# Patient Record
Sex: Male | Born: 1935 | Race: White | Hispanic: No
Health system: Southern US, Community
[De-identification: ages and names within clinical notes are randomized; demographics above are authoritative.]

---

## 2018-09-16 ENCOUNTER — Inpatient Hospital Stay (HOSPITAL_COMMUNITY)
Admission: AD | Admit: 2018-09-16 | Discharge: 2018-09-23 | DRG: 470 | Disposition: E | Payer: Medicare Other | Source: Other Acute Inpatient Hospital | Attending: Internal Medicine | Admitting: Internal Medicine

## 2018-09-16 ENCOUNTER — Encounter (HOSPITAL_COMMUNITY): Payer: Self-pay | Admitting: Internal Medicine

## 2018-09-16 DIAGNOSIS — Z794 Long term (current) use of insulin: Secondary | ICD-10-CM

## 2018-09-16 DIAGNOSIS — S72002A Fracture of unspecified part of neck of left femur, initial encounter for closed fracture: Principal | ICD-10-CM

## 2018-09-16 DIAGNOSIS — A419 Sepsis, unspecified organism: Secondary | ICD-10-CM | POA: Diagnosis not present

## 2018-09-16 DIAGNOSIS — S2241XA Multiple fractures of ribs, right side, initial encounter for closed fracture: Secondary | ICD-10-CM | POA: Diagnosis present

## 2018-09-16 DIAGNOSIS — E1165 Type 2 diabetes mellitus with hyperglycemia: Secondary | ICD-10-CM | POA: Diagnosis not present

## 2018-09-16 DIAGNOSIS — L97929 Non-pressure chronic ulcer of unspecified part of left lower leg with unspecified severity: Secondary | ICD-10-CM | POA: Diagnosis not present

## 2018-09-16 DIAGNOSIS — R0602 Shortness of breath: Secondary | ICD-10-CM

## 2018-09-16 DIAGNOSIS — E119 Type 2 diabetes mellitus without complications: Secondary | ICD-10-CM

## 2018-09-16 DIAGNOSIS — N390 Urinary tract infection, site not specified: Secondary | ICD-10-CM | POA: Diagnosis present

## 2018-09-16 DIAGNOSIS — M810 Age-related osteoporosis without current pathological fracture: Secondary | ICD-10-CM | POA: Diagnosis not present

## 2018-09-16 DIAGNOSIS — Z09 Encounter for follow-up examination after completed treatment for conditions other than malignant neoplasm: Secondary | ICD-10-CM

## 2018-09-16 DIAGNOSIS — W1830XA Fall on same level, unspecified, initial encounter: Secondary | ICD-10-CM | POA: Diagnosis present

## 2018-09-16 DIAGNOSIS — L97918 Non-pressure chronic ulcer of unspecified part of right lower leg with other specified severity: Secondary | ICD-10-CM | POA: Diagnosis present

## 2018-09-16 DIAGNOSIS — E1110 Type 2 diabetes mellitus with ketoacidosis without coma: Secondary | ICD-10-CM | POA: Insufficient documentation

## 2018-09-16 DIAGNOSIS — N3000 Acute cystitis without hematuria: Secondary | ICD-10-CM | POA: Diagnosis not present

## 2018-09-16 DIAGNOSIS — L97919 Non-pressure chronic ulcer of unspecified part of right lower leg with unspecified severity: Secondary | ICD-10-CM | POA: Diagnosis present

## 2018-09-16 DIAGNOSIS — R740 Nonspecific elevation of levels of transaminase and lactic acid dehydrogenase [LDH]: Secondary | ICD-10-CM | POA: Diagnosis present

## 2018-09-16 DIAGNOSIS — I878 Other specified disorders of veins: Secondary | ICD-10-CM | POA: Diagnosis present

## 2018-09-16 DIAGNOSIS — I469 Cardiac arrest, cause unspecified: Secondary | ICD-10-CM | POA: Diagnosis not present

## 2018-09-16 DIAGNOSIS — Y92009 Unspecified place in unspecified non-institutional (private) residence as the place of occurrence of the external cause: Secondary | ICD-10-CM

## 2018-09-16 DIAGNOSIS — E11622 Type 2 diabetes mellitus with other skin ulcer: Secondary | ICD-10-CM | POA: Diagnosis present

## 2018-09-16 DIAGNOSIS — I10 Essential (primary) hypertension: Secondary | ICD-10-CM | POA: Diagnosis present

## 2018-09-16 DIAGNOSIS — T148XXA Other injury of unspecified body region, initial encounter: Secondary | ICD-10-CM

## 2018-09-16 DIAGNOSIS — D696 Thrombocytopenia, unspecified: Secondary | ICD-10-CM | POA: Diagnosis not present

## 2018-09-16 DIAGNOSIS — S7292XA Unspecified fracture of left femur, initial encounter for closed fracture: Secondary | ICD-10-CM | POA: Insufficient documentation

## 2018-09-16 LAB — GLUCOSE, CAPILLARY: GLUCOSE-CAPILLARY: 174 mg/dL — AB (ref 70–99)

## 2018-09-16 NOTE — H&P (Signed)
History and Physical   Tom Little FAO:[id number] DOB: [birth year] DOA: 08/30/2018  Referring MD/NP/PA: From Cy Blamer  PCP: No primary care provider on file.   Outpatient Specialists:  in [place]  Patient coming from: [place]  Chief Complaint: Left hip pain  HPI: Tom Little is a 83 y.o. male with medical history significant of diabetes, hypertension, gout, multiple feet ulcers due to diabetes, recent sepsis due to UTI, UTI with some rhabdomyolysis who sustained a mechanical fall at home and was seen at hospital in [facility].  Patient apparently took his diabetic medications at night.  He was using a cane to go to the bathroom when he fell.  Was admitted at [facility] on October 20 S.  He fell on October 17.  He apparently has a 24-hour caregiver who was unable to move him at the time.  He has been globally weak.  On arrival at the hospital he was found to have significant UTI and sepsis.  This was treated.  Patient also found to have left hip fracture.  Initial recommendations was for outpatient follow-up.  Patient is agreed.  He requests transfer and patient was subsequently transferred here for surgical repair.  His x-rays at [facility] showed right-sided rib fractures and small right effusion with no pneumothorax.  Also pulmonary contusion.  X-ray of the feet showed possible fracture of the lateral malleolus of the left ankle.  He also had acute thrombocytopenia as well as transaminitis.  Elevated CPK reflecting mild rhabdomyolysis we also noted.  Patient also had right thumb dislocation which was reduced.  His fracture was discovered on 1023.  Orthopedic group over there refused to do surgery because they have fired him from their practice.  He apparently had grievances of harassment with their staff.  Dr. Everardo Pacific has accepted patient here for surgery   Review of Systems: As per HPI otherwise 10 point review of systems negative.    History reviewed.  No pertinent past medical history.  History reviewed. No pertinent surgical history.   has no tobacco, alcohol, and drug history on file.  Allergies not on file  History reviewed. No pertinent family history.   Prior to Admission medications   Not on File    Physical Exam: Vitals:   09/17/2018 2303  BP: (!) 153/72  Pulse: 72  Resp: 18  Temp: 98.3 F (36.8 C)  TempSrc: Tympanic  SpO2: 92%      Constitutional: NAD, calm, comfortable, frail, weak. Vitals:   09/01/2018 2303  BP: (!) 153/72  Pulse: 72  Resp: 18  Temp: 98.3 F (36.8 C)  TempSrc: Tympanic  SpO2: 92%   Eyes: PERRL, lids and conjunctivae normal ENMT: Mucous membranes are moist. Posterior pharynx clear of any exudate or lesions.Normal dentition.  Neck: normal, supple, no masses, no thyromegaly Respiratory: clear to auscultation bilaterally, no wheezing, no crackles. Normal respiratory effort. No accessory muscle use.  Cardiovascular: Regular rate and rhythm, no murmurs / rubs / gallops. No extremity edema. 2+ pedal pulses. No carotid bruits.  Abdomen: no tenderness, no masses palpated. No hepatosplenomegaly. Bowel sounds positive.  Musculoskeletal: Laterally rotated left leg, no clubbing / cyanosis.  Right thumb deformity upper and lower extremities. Good ROM, no contractures. Normal muscle tone.  Skin: no rashes, lesions, multiple leg ulcers. No induration Neurologic: CN 2-12 grossly intact. Sensation intact, DTR normal. Strength 5/5 in all 4.  Psychiatric: Normal judgment and insight. Alert and oriented x 3. Normal mood.     Labs on  Admission: I have personally reviewed following labs and imaging studies  CBC: No results for input(s): WBC, NEUTROABS, HGB, HCT, MCV, PLT in the last 168 hours. Basic Metabolic Panel: No results for input(s): NA, K, CL, CO2, GLUCOSE, BUN, CREATININE, CALCIUM, MG, PHOS in the last 168 hours. GFR: CrCl cannot be calculated (No successful lab value found.). Liver Function  Tests: No results for input(s): AST, ALT, ALKPHOS, BILITOT, PROT, ALBUMIN in the last 168 hours. No results for input(s): LIPASE, AMYLASE in the last 168 hours. No results for input(s): AMMONIA in the last 168 hours. Coagulation Profile: No results for input(s): INR, PROTIME in the last 168 hours. Cardiac Enzymes: No results for input(s): CKTOTAL, CKMB, CKMBINDEX, TROPONINI in the last 168 hours. BNP (last 3 results) No results for input(s): PROBNP in the last 8760 hours. HbA1C: No results for input(s): HGBA1C in the last 72 hours. CBG: Recent Labs  Lab 09/02/2018 2309  GLUCAP 174*   Lipid Profile: No results for input(s): CHOL, HDL, LDLCALC, TRIG, CHOLHDL, LDLDIRECT in the last 72 hours. Thyroid Function Tests: No results for input(s): TSH, T4TOTAL, FREET4, T3FREE, THYROIDAB in the last 72 hours. Anemia Panel: No results for input(s): VITAMINB12, FOLATE, FERRITIN, TIBC, IRON, RETICCTPCT in the last 72 hours. Urine analysis: No results found for: COLORURINE, APPEARANCEUR, LABSPEC, PHURINE, GLUCOSEU, HGBUR, BILIRUBINUR, KETONESUR, PROTEINUR, UROBILINOGEN, NITRITE, LEUKOCYTESUR Sepsis Labs: @LABRCNTIP (procalcitonin:4,lacticidven:4) )No results found for this or any previous visit (from the past 240 hour(s)).   Radiological Exams on Admission: No results found.  Assessment/Plan Principal Problem:   Fracture of femoral neck, left (HCC) Active Problems:   UTI (urinary tract infection)   Sepsis (HCC)   HTN (hypertension)   Ulcers of both lower legs (HCC)   Diabetes (HCC)     #1 left femoral neck fracture: Patient looks hemodynamically stable.  Medically cleared.  Orthopedics would likely do surgery tomorrow.  N.p.o. after midnight.  #2 recent UTI: Continue antibiotics to complete treatment.  Review culture and sensitivity results from outside hospital.  #3 recent sepsis.  Due to UTI. Appears to have resolved.  Continue treatment  #4 multiple skin ulcers: Wound care consult.   Most likely secondary to diabetes.  #5 diabetes: Sliding scale insulin will be added.  Update patient's home regimen and continue.  #6 hypertension: Blood pressure appears reasonably controlled on arrival.  Will monitor closely.   DVT prophylaxis: Heparin Code Status: Full Family Communication: No family available Disposition Plan: To be determined Consults called: Dr. Everardo Pacific, orthopedics Admission status: Inpatient  Severity of Illness: The appropriate patient status for this patient is INPATIENT. Inpatient status is judged to be reasonable and necessary in order to provide the required intensity of service to ensure the patient's safety. The patient's presenting symptoms, physical exam findings, and initial radiographic and laboratory data in the context of their chronic comorbidities is felt to place them at high risk for further clinical deterioration. Furthermore, it is not anticipated that the patient will be medically stable for discharge from the hospital within 2 midnights of admission. The following factors support the patient status of inpatient.   " The patient's presenting symptoms include left hip pain. " The worrisome physical exam findings include ambulation and decreased range of motion on the left hip. " The initial radiographic and laboratory data are worrisome because of evidence of hip fracture on the left. " The chronic co-morbidities include osteoporosis.   * I certify that at the point of admission it is my clinical judgment that the patient  will require inpatient hospital care spanning beyond 2 midnights from the point of admission due to high intensity of service, high risk for further deterioration and high frequency of surveillance required.Lonia Blood MD [facility] Pager [phone]  If 7PM-7AM, please contact night-coverage [email or web address] Password [email or web address]  09/17/2018, 12:09 AM

## 2018-09-17 ENCOUNTER — Inpatient Hospital Stay (HOSPITAL_COMMUNITY): Payer: Medicare Other

## 2018-09-17 DIAGNOSIS — E119 Type 2 diabetes mellitus without complications: Secondary | ICD-10-CM

## 2018-09-17 DIAGNOSIS — E1165 Type 2 diabetes mellitus with hyperglycemia: Secondary | ICD-10-CM

## 2018-09-17 DIAGNOSIS — Z794 Long term (current) use of insulin: Secondary | ICD-10-CM

## 2018-09-17 LAB — COMPREHENSIVE METABOLIC PANEL
ALBUMIN: 2.1 g/dL — AB (ref 3.5–5.0)
ALK PHOS: 68 U/L (ref 38–126)
ALT: 50 U/L — AB (ref 0–44)
AST: 47 U/L — AB (ref 15–41)
Anion gap: 8 (ref 5–15)
BUN: 26 mg/dL — AB (ref 8–23)
CALCIUM: 8.1 mg/dL — AB (ref 8.9–10.3)
CO2: 25 mmol/L (ref 22–32)
CREATININE: 1.08 mg/dL (ref 0.61–1.24)
Chloride: 109 mmol/L (ref 98–111)
GFR calc Af Amer: >60 mL/min (ref >60)
GFR calc non Af Amer: >60 mL/min (ref >60)
GLUCOSE: 121 mg/dL — AB (ref 70–99)
Potassium: 4.1 mmol/L (ref 3.5–5.1)
Sodium: 142 mmol/L (ref 135–145)
TOTAL PROTEIN: 5.3 g/dL — AB (ref 6.5–8.1)
Total Bilirubin: 1.2 mg/dL (ref 0.3–1.2)

## 2018-09-17 LAB — GLUCOSE, CAPILLARY
Glucose-Capillary: 112 mg/dL — ABNORMAL HIGH (ref 70–99)
Glucose-Capillary: 104 mg/dL — ABNORMAL HIGH (ref 70–99)
Glucose-Capillary: 161 mg/dL — ABNORMAL HIGH (ref 70–99)
Glucose-Capillary: 142 mg/dL — ABNORMAL HIGH (ref 70–99)

## 2018-09-17 LAB — CBC
HCT: 41.8 % (ref 39.0–52.0)
Hemoglobin: 13 g/dL (ref 13.0–17.0)
MCH: 28.5 pg (ref 26.0–34.0)
MCHC: 31.1 g/dL (ref 30.0–36.0)
MCV: 91.7 fL (ref 80.0–100.0)
PLATELETS: 133 10*3/uL — AB (ref 150–400)
RBC: 4.56 MIL/uL (ref 4.22–5.81)
RDW: 14.3 % (ref 11.5–15.5)
WBC: 10 10*3/uL (ref 4.0–10.5)
nRBC: 0.2 % (ref 0.0–0.2)

## 2018-09-17 LAB — SURGICAL PCR SCREEN
MRSA, PCR: NEGATIVE
STAPHYLOCOCCUS AUREUS: NEGATIVE

## 2018-09-17 LAB — TSH: TSH: 6.346 u[IU]/mL — AB (ref 0.350–4.500)

## 2018-09-17 MED ORDER — INSULIN ASPART 100 UNIT/ML ~~LOC~~ SOLN
0.0000 [IU] | Freq: Every day | SUBCUTANEOUS | Status: DC
  Administered 2018-09-18: 3 [IU] via SUBCUTANEOUS

## 2018-09-17 MED ORDER — HEPARIN SODIUM (PORCINE) 5000 UNIT/ML IJ SOLN: 5000.0000 [IU] | Freq: Three times a day (TID) | INTRAMUSCULAR | Status: DC

## 2018-09-17 MED ORDER — MORPHINE SULFATE (PF) 2 MG/ML IV SOLN
2.0000 mg | INTRAVENOUS | Status: DC | PRN
  Administered 2018-09-17 – 2018-09-18 (×5): 2 mg via INTRAVENOUS
  Filled 2018-09-17 (×6): qty 1

## 2018-09-17 MED ORDER — ALLOPURINOL 300 MG PO TABS
300.0000 mg | ORAL_TABLET | Freq: Every day | ORAL | Status: DC
  Administered 2018-09-17: 300 mg via ORAL
  Filled 2018-09-17 (×2): qty 1

## 2018-09-17 MED ORDER — SODIUM CHLORIDE 0.9% FLUSH
10.0000 mL | Freq: Two times a day (BID) | INTRAVENOUS | Status: DC
  Administered 2018-09-17 – 2018-09-18 (×3): 10 mL

## 2018-09-17 MED ORDER — LISINOPRIL 40 MG PO TABS
40.0000 mg | ORAL_TABLET | Freq: Every day | ORAL | Status: DC
  Administered 2018-09-17: 40 mg via ORAL
  Filled 2018-09-17 (×2): qty 1

## 2018-09-17 MED ORDER — ONDANSETRON HCL 4 MG PO TABS: 4.0000 mg | ORAL_TABLET | Freq: Four times a day (QID) | ORAL | Status: DC | PRN

## 2018-09-17 MED ORDER — SODIUM CHLORIDE 0.9 % IV SOLN
INTRAVENOUS | Status: DC
  Administered 2018-09-17: 01:00:00 via INTRAVENOUS

## 2018-09-17 MED ORDER — IPRATROPIUM-ALBUTEROL 0.5-2.5 (3) MG/3ML IN SOLN: 3.0000 mL | RESPIRATORY_TRACT | Status: DC | PRN

## 2018-09-17 MED ORDER — INSULIN ASPART 100 UNIT/ML ~~LOC~~ SOLN
0.0000 [IU] | Freq: Three times a day (TID) | SUBCUTANEOUS | Status: DC
  Administered 2018-09-17: 3 [IU] via SUBCUTANEOUS
  Administered 2018-09-18: 8 [IU] via SUBCUTANEOUS
  Administered 2018-09-18 – 2018-09-19 (×2): 3 [IU] via SUBCUTANEOUS

## 2018-09-17 MED ORDER — FUROSEMIDE 10 MG/ML IJ SOLN
60.0000 mg | Freq: Once | INTRAMUSCULAR | Status: AC
  Administered 2018-09-17: 60 mg via INTRAVENOUS
  Filled 2018-09-17: qty 6

## 2018-09-17 MED ORDER — ONDANSETRON HCL 4 MG/2ML IJ SOLN: 4.0000 mg | Freq: Four times a day (QID) | INTRAMUSCULAR | Status: DC | PRN

## 2018-09-17 MED ORDER — SODIUM CHLORIDE 0.9% FLUSH: 10.0000 mL | INTRAVENOUS | Status: DC | PRN

## 2018-09-17 MED ORDER — HYDROCERIN EX CREA
TOPICAL_CREAM | Freq: Two times a day (BID) | CUTANEOUS | Status: DC
  Administered 2018-09-17 – 2018-09-18 (×2): via TOPICAL
  Filled 2018-09-17: qty 113

## 2018-09-17 NOTE — Progress Notes (Signed)
Patient transferred from [facility] via [facility]. Patient is alert and oriented. Patient oriented to the floor. Call bell within reach. Will continue to monitor.

## 2018-09-17 NOTE — Progress Notes (Signed)
Patient ID: Tom Little, male   DOB: 07-13-1935, 83 y.o.   MRN: [id number]  PROGRESS NOTE    Rojelio Uhrich  AOZ:[id number] DOB: 1935/03/07 DOA: 08/23/2018 PCP: No primary care provider on file.   Brief Narrative:  83 year old male with history of diabetes, hypertension, gout, multiple feet ulcers due to diabetes who had fallen at home on September 08, 2018 and was admitted to [facility] few days ago for sepsis secondary to UTI along with rhabdomyolysis along with left hip fracture, right-sided rib fractures with small right effusion but no pneumothorax and possible fracture of the lateral malleolus of the left ankle.  Patient was transferred to [facility] on 09/21/2018 for further  orthopedic intervention.  Assessment & Plan:   Principal Problem:   Fracture of femoral neck, left (HCC) Active Problems:   UTI (urinary tract infection)   Sepsis (HCC)   HTN (hypertension)   Ulcers of both lower legs (HCC)   Diabetes (HCC)   Left femoral neck fracture after mechanical fall -Spoke to Dr. Bo Mcclintock on phone who will see the patient in consultation.  For probable surgical intervention on Sep 22, 2018.  We will keep n.p.o. past midnight. -Fall precautions.  Pain management  Recent treatment for UTI and sepsis -Patient has completed antibiotic treatment and sepsis has resolved.  No signs of infection at this time.  Will hold off on antibiotics  Multiple skin ulcers -Wound care consult  Diabetes mellitus type 2 -Continue Accu-Cheks with sliding scale coverage.  Hypertension -Blood pressure on the higher side.  Will monitor.  Mild transaminitis -Most likely secondary to recent rhabdomyolysis.  Monitor  Thrombocytopenia -Questionable cause.  Monitor.  No signs of bleeding   DVT prophylaxis: We will hold Lovenox in preparation of surgery Code Status: Full Family Communication: None at bedside Disposition Plan: Depends on clinical outcome  Consultants:  Orthopedics  Procedures: None  Antimicrobials: None   Subjective: Patient seen and examined at bedside.  He is a poor historian.  Complains of hip pain.  No overnight fever or vomiting.  Objective: Vitals:   08/26/2018 2303 09/17/18 0348  BP: (!) 153/72 (!) 179/77  Pulse: 72 61  Resp: 18   Temp: 98.3 F (36.8 C) 97.8 F (36.6 C)  TempSrc: Tympanic Axillary  SpO2: 92% 95%    Intake/Output Summary (Last 24 hours) at 09/17/2018 9629 Last data filed at 09/17/2018 5284 Gross per 24 hour  Intake 218.6 ml  Output -  Net 218.6 ml   There were no vitals filed for this visit.  Examination:  General exam: Appears calm and comfortable.  Poor historian. Respiratory system: Bilateral decreased breath sounds at bases Cardiovascular system: S1 & S2 heard, Rate controlled Gastrointestinal system: Abdomen is nondistended, soft and nontender. Normal bowel sounds heard. Extremities: No cyanosis, edema.  Multiple leg ulcers present   Data Reviewed: I have personally reviewed following labs and imaging studies  CBC: Recent Labs  Lab 09/17/18 0736  WBC 10.0  HGB 13.0  HCT 41.8  MCV 91.7  PLT 133*   Basic Metabolic Panel: Recent Labs  Lab 09/17/18 0736  NA 142  K 4.1  CL 109  CO2 25  GLUCOSE 121*  BUN 26*  CREATININE 1.08  CALCIUM 8.1*   GFR: CrCl cannot be calculated (Unknown ideal weight.). Liver Function Tests: Recent Labs  Lab 09/17/18 0736  AST 47*  ALT 50*  ALKPHOS 68  BILITOT 1.2  PROT 5.3*  ALBUMIN 2.1*   No results for input(s): LIPASE, AMYLASE  in the last 168 hours. No results for input(s): AMMONIA in the last 168 hours. Coagulation Profile: No results for input(s): INR, PROTIME in the last 168 hours. Cardiac Enzymes: No results for input(s): CKTOTAL, CKMB, CKMBINDEX, TROPONINI in the last 168 hours. BNP (last 3 results) No results for input(s): PROBNP in the last 8760 hours. HbA1C: No results for input(s): HGBA1C in the last 72  hours. CBG: Recent Labs  Lab 09-28-2018 2309 09/17/18 0645  GLUCAP 174* 112*   Lipid Profile: No results for input(s): CHOL, HDL, LDLCALC, TRIG, CHOLHDL, LDLDIRECT in the last 72 hours. Thyroid Function Tests: Recent Labs    09/17/18 0736  TSH 6.346*   Anemia Panel: No results for input(s): VITAMINB12, FOLATE, FERRITIN, TIBC, IRON, RETICCTPCT in the last 72 hours. Sepsis Labs: No results for input(s): PROCALCITON, LATICACIDVEN in the last 168 hours.  Recent Results (from the past 240 hour(s))  Surgical pcr screen     Status: None   Collection Time: 09/17/18  1:58 AM  Result Value Ref Range Status   MRSA, PCR NEGATIVE NEGATIVE Final   Staphylococcus aureus NEGATIVE NEGATIVE Final    Comment: (NOTE) The Xpert SA Assay (FDA approved for NASAL specimens in patients 61 years of age and older), is one component of a comprehensive surveillance program. It is not intended to diagnose infection nor to guide or monitor treatment. Performed at [facility], [address] [place], [place] [address]          Radiology Studies: No results found.      Scheduled Meds: . insulin aspart  0-15 Units Subcutaneous TID WC  . insulin aspart  0-5 Units Subcutaneous QHS   Continuous Infusions: . sodium chloride 100 mL/hr at 09/17/18 0100     LOS: 1 day        Glade Lloyd, MD [facility] Pager [phone]  If 7PM-7AM, please contact night-coverage [email or web address] Password [facility] 09/17/2018, 9:39 AM

## 2018-09-17 NOTE — Consult Note (Signed)
ORTHOPAEDIC CONSULTATION  REQUESTING PHYSICIAN: Aline August, MD  Chief Complaint: Left hip fracture  HPI: Tom Little is a 83 y.o. male with recent hospitalization in [place] for sepsis.  He apparently per report had falls prior to admission a week ago and it was determined yeterday that he had a left femoral neck fracture.    Due to some social issues the patient been fired from orthopedic practices near his hospital where he was admitted and was unable to be transferred to 1 of their facilities.  He apparently per report had threatened staff at these facilities and they were uncomfortable taking care of him.  We agreed to have him transferred to the hospital service and provide the orthopedic services indicated.  Patient arrived overnight were not alerted to his presence.  He has an injury to his thumb but he has no imaging of this is unable to determine what this may be.  He does have pain in his left hip as well. History reviewed. No pertinent past medical history. History reviewed. No pertinent surgical history. Social History   Socioeconomic History  . Marital status: Widowed    Spouse name: Not on file  . Number of children: Not on file  . Years of education: Not on file  . Highest education level: Not on file  Occupational History  . Not on file  Social Needs  . Financial resource strain: Not on file  . Food insecurity:    Worry: Not on file    Inability: Not on file  . Transportation needs:    Medical: Not on file    Non-medical: Not on file  Tobacco Use  . Smoking status: Not on file  Substance and Sexual Activity  . Alcohol use: Not on file  . Drug use: Not on file  . Sexual activity: Not on file  Lifestyle  . Physical activity:    Days per week: Not on file    Minutes per session: Not on file  . Stress: Not on file  Relationships  . Social connections:    Talks on phone: Not on file    Gets together: Not on file    Attends  religious service: Not on file    Active member of club or organization: Not on file    Attends meetings of clubs or organizations: Not on file    Relationship status: Not on file  Other Topics Concern  . Not on file  Social History Narrative  . Not on file   History reviewed. No pertinent family history. Not on File Prior to Admission medications   Not on File   No results found. Family History Reviewed and non-contributory, no pertinent history of problems with bleeding or anesthesia      Review of Systems 14 system ROS conducted and negative except for that noted in HPI   OBJECTIVE  Vitals: Patient Vitals for the past 8 hrs:  BP Temp Temp src Pulse SpO2  09/17/18 0348 (!) 179/77 97.8 F (36.6 C) Axillary 61 95 %   General: Alert, no acute distress Cardiovascular: No pedal edema Respiratory: No cyanosis, no use of accessory musculature GI: No organomegaly, abdomen is soft and non-tender Skin: No lesions in the area of chief complaint other than those listed below in MSK exam.  Neurologic: Sensation intact distally save for the below mentioned MSK exam Psychiatric: Patient is competent for consent with normal mood and affect Lymphatic: No axillary or cervical lymphadenopathy Extremities  Left lower extremity: Venous stasis ulcers and chronic ulcerations around the heel noted, externally rotated and shortened extremity, able to wiggle toes further testing precluded due to patient factors.  No sign of current active infection however, warm foot difficult to palpate pulses.  Right lower extremity chronic venous stasis changes and bedsores noted.  Right thumb some tenderness to palpation but no obvious deformity, splint in place though not well fitting.    Test Results Imaging Viewed x-rays on disc demonstrated a displaced left femoral neck fracture, ankle x-rays on the left were reviewed and no obvious acute osseous abnormality.  Labs cbc Recent Labs     09/17/18 0736  WBC 10.0  HGB 13.0  HCT 41.8  PLT 133*    Labs inflam No results for input(s): CRP in the last 72 hours.  Invalid input(s): ESR  Labs coag No results for input(s): INR, PTT in the last 72 hours.  Invalid input(s): PT  Recent Labs    09/17/18 0736  NA 142  K 4.1  CL 109  CO2 25  GLUCOSE 121*  BUN 26*  CREATININE 1.08  CALCIUM 8.1*     ASSESSMENT AND PLAN: 83 y.o. male with the following: Left femoral neck fracture, possible right thumb injury  Patient's late arrival in the OR schedule precluded timely surgery today.  Additionally he needs further work-up with x-rays prior to going forward to the OR.  We felt that is more appropriate as the patient set up for first thing the morning tomorrow.  Discussed the nature of the injury as well as the care with the patient as well as the family.  Nonoperative measures are not well tolerated as patient's on bedrest for extended periods of time tend to develop secondary issues such as pneumonia, urinary tract infections, bedsores and delirium.  Based on this our recommendation is for operative measures.  The risks benefits and alternatives were discussed with the patient including but not limited to the risks of nonoperative treatment, versus surgical intervention including infection, bleeding, nerve injury, periprosthetic fracture, the need for revision surgery, leg length discrepancy, gait change, blood clots, cardiopulmonary complications, morbidity, mortality, among others, and they were willing to proceed.

## 2018-09-17 NOTE — H&P (View-Only) (Signed)
ORTHOPAEDIC CONSULTATION  REQUESTING PHYSICIAN: Aline August, MD  Chief Complaint: Left hip fracture  HPI: Tom Little is a 83 y.o. male with recent hospitalization in [place] for sepsis.  He apparently per report had falls prior to admission a week ago and it was determined yeterday that he had a left femoral neck fracture.    Due to some social issues the patient been fired from orthopedic practices near his hospital where he was admitted and was unable to be transferred to 1 of their facilities.  He apparently per report had threatened staff at these facilities and they were uncomfortable taking care of him.  We agreed to have him transferred to the hospital service and provide the orthopedic services indicated.  Patient arrived overnight were not alerted to his presence.  He has an injury to his thumb but he has no imaging of this is unable to determine what this may be.  He does have pain in his left hip as well. History reviewed. No pertinent past medical history. History reviewed. No pertinent surgical history. Social History   Socioeconomic History  . Marital status: Widowed    Spouse name: Not on file  . Number of children: Not on file  . Years of education: Not on file  . Highest education level: Not on file  Occupational History  . Not on file  Social Needs  . Financial resource strain: Not on file  . Food insecurity:    Worry: Not on file    Inability: Not on file  . Transportation needs:    Medical: Not on file    Non-medical: Not on file  Tobacco Use  . Smoking status: Not on file  Substance and Sexual Activity  . Alcohol use: Not on file  . Drug use: Not on file  . Sexual activity: Not on file  Lifestyle  . Physical activity:    Days per week: Not on file    Minutes per session: Not on file  . Stress: Not on file  Relationships  . Social connections:    Talks on phone: Not on file    Gets together: Not on file    Attends  religious service: Not on file    Active member of club or organization: Not on file    Attends meetings of clubs or organizations: Not on file    Relationship status: Not on file  Other Topics Concern  . Not on file  Social History Narrative  . Not on file   History reviewed. No pertinent family history. Not on File Prior to Admission medications   Not on File   No results found. Family History Reviewed and non-contributory, no pertinent history of problems with bleeding or anesthesia      Review of Systems 14 system ROS conducted and negative except for that noted in HPI   OBJECTIVE  Vitals: Patient Vitals for the past 8 hrs:  BP Temp Temp src Pulse SpO2  09/17/18 0348 (!) 179/77 97.8 F (36.6 C) Axillary 61 95 %   General: Alert, no acute distress Cardiovascular: No pedal edema Respiratory: No cyanosis, no use of accessory musculature GI: No organomegaly, abdomen is soft and non-tender Skin: No lesions in the area of chief complaint other than those listed below in MSK exam.  Neurologic: Sensation intact distally save for the below mentioned MSK exam Psychiatric: Patient is competent for consent with normal mood and affect Lymphatic: No axillary or cervical lymphadenopathy Extremities  Left lower extremity: Venous stasis ulcers and chronic ulcerations around the heel noted, externally rotated and shortened extremity, able to wiggle toes further testing precluded due to patient factors.  No sign of current active infection however, warm foot difficult to palpate pulses.  Right lower extremity chronic venous stasis changes and bedsores noted.  Right thumb some tenderness to palpation but no obvious deformity, splint in place though not well fitting.    Test Results Imaging Viewed x-rays on disc demonstrated a displaced left femoral neck fracture, ankle x-rays on the left were reviewed and no obvious acute osseous abnormality.  Labs cbc Recent Labs     09/17/18 0736  WBC 10.0  HGB 13.0  HCT 41.8  PLT 133*    Labs inflam No results for input(s): CRP in the last 72 hours.  Invalid input(s): ESR  Labs coag No results for input(s): INR, PTT in the last 72 hours.  Invalid input(s): PT  Recent Labs    09/17/18 0736  NA 142  K 4.1  CL 109  CO2 25  GLUCOSE 121*  BUN 26*  CREATININE 1.08  CALCIUM 8.1*     ASSESSMENT AND PLAN: 83 y.o. male with the following: Left femoral neck fracture, possible right thumb injury  Patient's late arrival in the OR schedule precluded timely surgery today.  Additionally he needs further work-up with x-rays prior to going forward to the OR.  We felt that is more appropriate as the patient set up for first thing the morning tomorrow.  Discussed the nature of the injury as well as the care with the patient as well as the family.  Nonoperative measures are not well tolerated as patient's on bedrest for extended periods of time tend to develop secondary issues such as pneumonia, urinary tract infections, bedsores and delirium.  Based on this our recommendation is for operative measures.  The risks benefits and alternatives were discussed with the patient including but not limited to the risks of nonoperative treatment, versus surgical intervention including infection, bleeding, nerve injury, periprosthetic fracture, the need for revision surgery, leg length discrepancy, gait change, blood clots, cardiopulmonary complications, morbidity, mortality, among others, and they were willing to proceed.

## 2018-09-17 NOTE — Consult Note (Signed)
WOC Nurse wound consult note Reason for Consult: No open areas to buttocks, small area of discoloration to left buttock appears more consistent with abrasion than PrI.  Abrasion to right upper arm.  Right great toenail site wound. Wound type:Partial and full thickness wound, no pressure injury.  Poor hygiene with dry flaking skin and hair that is in need of cleansing. Bilateral heels intact. Pressure Injury POA: N/A Measurement: Abrasion to right upper arm measures 6cm x 3cm x 0.1cm with pink, moist wound bed Abrasion to left buttock measures 2cm x 2.5cm with no depth dry RGT with nail missing, dry. Wound bed:As described above Drainage (amount, consistency, odor)  Periwound: Intact, dry and flaking. Dressing procedure/placement/frequency: I will provide guidance for nursing to turn and reposition per house protocol, place a sacral foam dressing to prevent pressure injury, place feet in bilateral pressure redistribution heel boots, and to perform topical care to the RGT and right upper arm abrasion.  WOC nursing team will not follow, but will remain available to this patient, the nursing and medical teams.  Please re-consult if needed. Thanks, Ladona Mow, MSN, RN, GNP, [place]  Pager# [phone]

## 2018-09-18 ENCOUNTER — Inpatient Hospital Stay (HOSPITAL_COMMUNITY): Payer: Medicare Other | Admitting: Certified Registered"

## 2018-09-18 ENCOUNTER — Inpatient Hospital Stay (HOSPITAL_COMMUNITY): Payer: Medicare Other

## 2018-09-18 ENCOUNTER — Encounter (HOSPITAL_COMMUNITY): Admission: AD | Disposition: E | Payer: Self-pay | Source: Other Acute Inpatient Hospital | Attending: Internal Medicine

## 2018-09-18 ENCOUNTER — Encounter (HOSPITAL_COMMUNITY): Payer: Self-pay | Admitting: Certified Registered Nurse Anesthetist

## 2018-09-18 HISTORY — PX: HIP ARTHROPLASTY: SHX981

## 2018-09-18 LAB — CBC WITH DIFFERENTIAL/PLATELET
Abs Immature Granulocytes: 0.34 10*3/uL — ABNORMAL HIGH (ref 0.00–0.07)
Basophils Absolute: 0 10*3/uL (ref 0.0–0.1)
Basophils Relative: 0 %
Eosinophils Absolute: 0.4 10*3/uL (ref 0.0–0.5)
Eosinophils Relative: 4 %
HCT: 39 % (ref 39.0–52.0)
Hemoglobin: 12.8 g/dL — ABNORMAL LOW (ref 13.0–17.0)
Immature Granulocytes: 4 %
Lymphocytes Relative: 21 %
Lymphs Abs: 1.9 10*3/uL (ref 0.7–4.0)
MCH: 29.3 pg (ref 26.0–34.0)
MCHC: 32.8 g/dL (ref 30.0–36.0)
MCV: 89.2 fL (ref 80.0–100.0)
Monocytes Absolute: 0.8 10*3/uL (ref 0.1–1.0)
Monocytes Relative: 8 %
Neutro Abs: 5.9 10*3/uL (ref 1.7–7.7)
Neutrophils Relative %: 63 %
Platelets: 152 10*3/uL (ref 150–400)
RBC: 4.37 MIL/uL (ref 4.22–5.81)
RDW: 14.1 % (ref 11.5–15.5)
WBC: 9.4 10*3/uL (ref 4.0–10.5)
nRBC: 0 % (ref 0.0–0.2)

## 2018-09-18 LAB — GLUCOSE, CAPILLARY
GLUCOSE-CAPILLARY: 167 mg/dL — AB (ref 70–99)
GLUCOSE-CAPILLARY: 258 mg/dL — AB (ref 70–99)
Glucose-Capillary: 141 mg/dL — ABNORMAL HIGH (ref 70–99)
Glucose-Capillary: 196 mg/dL — ABNORMAL HIGH (ref 70–99)
Glucose-Capillary: 284 mg/dL — ABNORMAL HIGH (ref 70–99)

## 2018-09-18 LAB — COMPREHENSIVE METABOLIC PANEL WITH GFR
ALT: 45 U/L — ABNORMAL HIGH (ref 0–44)
AST: 44 U/L — ABNORMAL HIGH (ref 15–41)
Albumin: 2.1 g/dL — ABNORMAL LOW (ref 3.5–5.0)
Alkaline Phosphatase: 69 U/L (ref 38–126)
Anion gap: 14 (ref 5–15)
BUN: 28 mg/dL — ABNORMAL HIGH (ref 8–23)
CO2: 22 mmol/L (ref 22–32)
Calcium: 8.3 mg/dL — ABNORMAL LOW (ref 8.9–10.3)
Chloride: 105 mmol/L (ref 98–111)
Creatinine, Ser: 1.12 mg/dL (ref 0.61–1.24)
GFR calc Af Amer: >60 mL/min
GFR calc non Af Amer: 60 mL/min — ABNORMAL LOW
Glucose, Bld: 134 mg/dL — ABNORMAL HIGH (ref 70–99)
Potassium: 3.8 mmol/L (ref 3.5–5.1)
Sodium: 141 mmol/L (ref 135–145)
Total Bilirubin: 0.9 mg/dL (ref 0.3–1.2)
Total Protein: 5.2 g/dL — ABNORMAL LOW (ref 6.5–8.1)

## 2018-09-18 LAB — MAGNESIUM: Magnesium: 1.8 mg/dL (ref 1.7–2.4)

## 2018-09-18 LAB — CK: CK TOTAL: 116 U/L (ref 49–397)

## 2018-09-18 SURGERY — HEMIARTHROPLASTY, HIP, DIRECT ANTERIOR APPROACH, FOR FRACTURE
Anesthesia: General | Site: Hip | Laterality: Left

## 2018-09-18 MED ORDER — SODIUM CHLORIDE 0.9 % IV SOLN
INTRAVENOUS | Status: DC | PRN
  Administered 2018-09-18: 25 ug/min via INTRAVENOUS

## 2018-09-18 MED ORDER — ENOXAPARIN SODIUM 40 MG/0.4ML ~~LOC~~ SOLN: 40.0000 mg | SUBCUTANEOUS | Status: DC

## 2018-09-18 MED ORDER — PROPOFOL 10 MG/ML IV BOLUS
INTRAVENOUS | Status: DC | PRN
  Administered 2018-09-18: 70 mg via INTRAVENOUS
  Administered 2018-09-18: 30 mg via INTRAVENOUS

## 2018-09-18 MED ORDER — LIDOCAINE 2% (20 MG/ML) 5 ML SYRINGE
INTRAMUSCULAR | Status: AC
  Filled 2018-09-18: qty 5

## 2018-09-18 MED ORDER — SUCCINYLCHOLINE CHLORIDE 20 MG/ML IJ SOLN
INTRAMUSCULAR | Status: DC | PRN
  Administered 2018-09-18: 80 mg via INTRAVENOUS

## 2018-09-18 MED ORDER — LIDOCAINE HCL (CARDIAC) PF 100 MG/5ML IV SOSY
PREFILLED_SYRINGE | INTRAVENOUS | Status: DC | PRN
  Administered 2018-09-18: 100 mg via INTRAVENOUS

## 2018-09-18 MED ORDER — CEFAZOLIN SODIUM-DEXTROSE 2-4 GM/100ML-% IV SOLN
2.0000 g | Freq: Four times a day (QID) | INTRAVENOUS | Status: AC
  Administered 2018-09-18 (×2): 2 g via INTRAVENOUS
  Filled 2018-09-18 (×2): qty 100

## 2018-09-18 MED ORDER — LACTATED RINGERS IV SOLN
INTRAVENOUS | Status: DC | PRN
  Administered 2018-09-18: 07:00:00 via INTRAVENOUS

## 2018-09-18 MED ORDER — 0.9 % SODIUM CHLORIDE (POUR BTL) OPTIME
TOPICAL | Status: DC | PRN
  Administered 2018-09-18: 1000 mL

## 2018-09-18 MED ORDER — CEFAZOLIN SODIUM-DEXTROSE 2-3 GM-%(50ML) IV SOLR
INTRAVENOUS | Status: DC | PRN
  Administered 2018-09-18: 2 g via INTRAVENOUS

## 2018-09-18 MED ORDER — SUCCINYLCHOLINE CHLORIDE 200 MG/10ML IV SOSY
PREFILLED_SYRINGE | INTRAVENOUS | Status: AC
  Filled 2018-09-18: qty 10

## 2018-09-18 MED ORDER — ROCURONIUM BROMIDE 50 MG/5ML IV SOSY
PREFILLED_SYRINGE | INTRAVENOUS | Status: AC
  Filled 2018-09-18: qty 15

## 2018-09-18 MED ORDER — ACETAMINOPHEN 500 MG PO TABS
1000.0000 mg | ORAL_TABLET | Freq: Three times a day (TID) | ORAL | Status: DC
  Administered 2018-09-18 – 2018-09-19 (×3): 1000 mg via ORAL
  Filled 2018-09-18 (×3): qty 2

## 2018-09-18 MED ORDER — CEFAZOLIN SODIUM-DEXTROSE 2-4 GM/100ML-% IV SOLN
INTRAVENOUS | Status: AC
  Filled 2018-09-18: qty 100

## 2018-09-18 MED ORDER — ONDANSETRON HCL 4 MG/2ML IJ SOLN
INTRAMUSCULAR | Status: AC
  Filled 2018-09-18: qty 2

## 2018-09-18 MED ORDER — METOCLOPRAMIDE HCL 5 MG/ML IJ SOLN: 5.0000 mg | Freq: Three times a day (TID) | INTRAMUSCULAR | Status: DC | PRN

## 2018-09-18 MED ORDER — PHENOL 1.4 % MT LIQD: 1.0000 | OROMUCOSAL | Status: DC | PRN

## 2018-09-18 MED ORDER — FENTANYL CITRATE (PF) 100 MCG/2ML IJ SOLN
INTRAMUSCULAR | Status: AC
  Filled 2018-09-18: qty 2

## 2018-09-18 MED ORDER — OXYCODONE HCL 5 MG PO TABS: 5.0000 mg | ORAL_TABLET | ORAL | Status: DC | PRN

## 2018-09-18 MED ORDER — DEXAMETHASONE SODIUM PHOSPHATE 10 MG/ML IJ SOLN
INTRAMUSCULAR | Status: AC
  Filled 2018-09-18: qty 1

## 2018-09-18 MED ORDER — METOCLOPRAMIDE HCL 5 MG PO TABS: 5.0000 mg | ORAL_TABLET | Freq: Three times a day (TID) | ORAL | Status: DC | PRN

## 2018-09-18 MED ORDER — OXYCODONE HCL 5 MG PO TABS: 5.0000 mg | ORAL_TABLET | Freq: Once | ORAL | Status: DC | PRN

## 2018-09-18 MED ORDER — VANCOMYCIN HCL 1 G IV SOLR
INTRAVENOUS | Status: DC | PRN
  Administered 2018-09-18: 1000 mg via TOPICAL

## 2018-09-18 MED ORDER — CELECOXIB 200 MG PO CAPS
200.0000 mg | ORAL_CAPSULE | Freq: Two times a day (BID) | ORAL | Status: DC
  Administered 2018-09-18 (×2): 200 mg via ORAL
  Filled 2018-09-18 (×2): qty 1

## 2018-09-18 MED ORDER — ROCURONIUM BROMIDE 100 MG/10ML IV SOLN
INTRAVENOUS | Status: DC | PRN
  Administered 2018-09-18: 50 mg via INTRAVENOUS
  Administered 2018-09-18: 10 mg via INTRAVENOUS

## 2018-09-18 MED ORDER — MENTHOL 3 MG MT LOZG: 1.0000 | LOZENGE | OROMUCOSAL | Status: DC | PRN

## 2018-09-18 MED ORDER — ONDANSETRON HCL 4 MG/2ML IJ SOLN: 4.0000 mg | Freq: Four times a day (QID) | INTRAMUSCULAR | Status: DC | PRN

## 2018-09-18 MED ORDER — FENTANYL CITRATE (PF) 250 MCG/5ML IJ SOLN
INTRAMUSCULAR | Status: AC
  Filled 2018-09-18: qty 5

## 2018-09-18 MED ORDER — PHENYLEPHRINE 40 MCG/ML (10ML) SYRINGE FOR IV PUSH (FOR BLOOD PRESSURE SUPPORT)
PREFILLED_SYRINGE | INTRAVENOUS | Status: AC
  Filled 2018-09-18: qty 10

## 2018-09-18 MED ORDER — DOCUSATE SODIUM 100 MG PO CAPS
100.0000 mg | ORAL_CAPSULE | Freq: Two times a day (BID) | ORAL | Status: DC
  Administered 2018-09-18 (×2): 100 mg via ORAL
  Filled 2018-09-18 (×2): qty 1

## 2018-09-18 MED ORDER — OXYCODONE HCL 5 MG/5ML PO SOLN: 5.0000 mg | Freq: Once | ORAL | Status: DC | PRN

## 2018-09-18 MED ORDER — VANCOMYCIN HCL 1000 MG IV SOLR
INTRAVENOUS | Status: AC
  Filled 2018-09-18: qty 1000

## 2018-09-18 MED ORDER — ONDANSETRON HCL 4 MG/2ML IJ SOLN
INTRAMUSCULAR | Status: DC | PRN
  Administered 2018-09-18: 4 mg via INTRAVENOUS

## 2018-09-18 MED ORDER — SUGAMMADEX SODIUM 200 MG/2ML IV SOLN
INTRAVENOUS | Status: DC | PRN
  Administered 2018-09-18: 200 mg via INTRAVENOUS

## 2018-09-18 MED ORDER — FENTANYL CITRATE (PF) 100 MCG/2ML IJ SOLN
25.0000 ug | INTRAMUSCULAR | Status: DC | PRN
  Administered 2018-09-18: 50 ug via INTRAVENOUS

## 2018-09-18 MED ORDER — PHENYLEPHRINE 40 MCG/ML (10ML) SYRINGE FOR IV PUSH (FOR BLOOD PRESSURE SUPPORT)
PREFILLED_SYRINGE | INTRAVENOUS | Status: DC | PRN
  Administered 2018-09-18 (×2): 80 ug via INTRAVENOUS

## 2018-09-18 MED ORDER — ONDANSETRON HCL 4 MG PO TABS: 4.0000 mg | ORAL_TABLET | Freq: Four times a day (QID) | ORAL | Status: DC | PRN

## 2018-09-18 MED ORDER — FENTANYL CITRATE (PF) 100 MCG/2ML IJ SOLN
INTRAMUSCULAR | Status: DC | PRN
  Administered 2018-09-18: 100 ug via INTRAVENOUS
  Administered 2018-09-18 (×2): 25 ug via INTRAVENOUS

## 2018-09-18 MED ORDER — TRANEXAMIC ACID-NACL 1000-0.7 MG/100ML-% IV SOLN
1000.0000 mg | INTRAVENOUS | Status: AC
  Administered 2018-09-18: 1000 mg via INTRAVENOUS
  Filled 2018-09-18: qty 100

## 2018-09-18 MED ORDER — ENOXAPARIN SODIUM 40 MG/0.4ML ~~LOC~~ SOLN
40.0000 mg | SUBCUTANEOUS | Status: DC
  Administered 2018-09-19: 40 mg via SUBCUTANEOUS
  Filled 2018-09-18 (×2): qty 0.4

## 2018-09-18 SURGICAL SUPPLY — 52 items
BLADE SAGITTAL 25.0X1.27X90 (BLADE) ×2 IMPLANT
BLADE SAGITTAL 25.0X1.27X90MM (BLADE) ×1
CHLORAPREP W/TINT 26ML (MISCELLANEOUS) ×6 IMPLANT
CLOSURE STERI-STRIP 1/2X4 (GAUZE/BANDAGES/DRESSINGS) ×1
CLSR STERI-STRIP ANTIMIC 1/2X4 (GAUZE/BANDAGES/DRESSINGS) ×2 IMPLANT
COVER SURGICAL LIGHT HANDLE (MISCELLANEOUS) ×3 IMPLANT
COVER WAND RF STERILE (DRAPES) ×3 IMPLANT
DRAPE INCISE IOBAN 66X45 STRL (DRAPES) ×3 IMPLANT
DRAPE ORTHO SPLIT 77X108 STRL (DRAPES) ×4
DRAPE SURG ORHT 6 SPLT 77X108 (DRAPES) ×2 IMPLANT
DRAPE U-SHAPE 47X51 STRL (DRAPES) ×3 IMPLANT
DRESSING AQUACEL AG SP 3.5X10 (GAUZE/BANDAGES/DRESSINGS) IMPLANT
DRSG AQUACEL AG SP 3.5X10 (GAUZE/BANDAGES/DRESSINGS) ×3
ELECT BLADE 4.0 EZ CLEAN MEGAD (MISCELLANEOUS) ×3
ELECT CAUTERY BLADE 6.4 (BLADE) ×3 IMPLANT
ELECT REM PT RETURN 9FT ADLT (ELECTROSURGICAL) ×3
ELECTRODE BLDE 4.0 EZ CLN MEGD (MISCELLANEOUS) IMPLANT
ELECTRODE REM PT RTRN 9FT ADLT (ELECTROSURGICAL) ×1 IMPLANT
GLOVE BIO SURGEON STRL SZ8 (GLOVE) ×6 IMPLANT
GLOVE BIOGEL PI IND STRL 8 (GLOVE) ×1 IMPLANT
GLOVE BIOGEL PI INDICATOR 8 (GLOVE) ×2
GLOVE BIOGEL PI ORTHO PRO SZ8 (GLOVE)
GLOVE PI ORTHO PRO STRL SZ8 (GLOVE) IMPLANT
GOWN STRL REUS W/ TWL LRG LVL3 (GOWN DISPOSABLE) ×2 IMPLANT
GOWN STRL REUS W/ TWL XL LVL3 (GOWN DISPOSABLE) ×2 IMPLANT
GOWN STRL REUS W/TWL 2XL LVL3 (GOWN DISPOSABLE) ×2 IMPLANT
GOWN STRL REUS W/TWL LRG LVL3 (GOWN DISPOSABLE) ×6
GOWN STRL REUS W/TWL XL LVL3 (GOWN DISPOSABLE) ×4
HEAD MODULAR ENDO (Orthopedic Implant) ×2 IMPLANT
HEAD UNPLR 50XMDLR STRL HIP (Orthopedic Implant) IMPLANT
KIT BASIN OR (CUSTOM PROCEDURE TRAY) ×3 IMPLANT
KIT TURNOVER KIT B (KITS) ×3 IMPLANT
MANIFOLD NEPTUNE II (INSTRUMENTS) ×3 IMPLANT
NDL MAYO TROCAR (NEEDLE) ×1 IMPLANT
NEEDLE MAYO TROCAR (NEEDLE) ×3 IMPLANT
PACK TOTAL JOINT (CUSTOM PROCEDURE TRAY) ×3 IMPLANT
PAD ARMBOARD 7.5X6 YLW CONV (MISCELLANEOUS) ×6 IMPLANT
PILLOW ABDUCTION HIP (SOFTGOODS) ×3 IMPLANT
RETRIEVER SUT HEWSON (MISCELLANEOUS) ×3 IMPLANT
SLEEVE UNITRAX (Orthopedic Implant) ×2 IMPLANT
STAPLER VISISTAT 35W (STAPLE) IMPLANT
STEM HIP 127 DEG (Stem) ×2 IMPLANT
SUT FIBERWIRE #2 38 REV NDL BL (SUTURE) ×15
SUT MON AB 3-0 SH 27 (SUTURE)
SUT MON AB 3-0 SH27 (SUTURE) IMPLANT
SUT VIC AB 0 CT1 27 (SUTURE) ×4
SUT VIC AB 0 CT1 27XBRD ANBCTR (SUTURE) ×2 IMPLANT
SUT VIC AB 2-0 CT1 27 (SUTURE) ×6
SUT VIC AB 2-0 CT1 TAPERPNT 27 (SUTURE) ×2 IMPLANT
SUTURE FIBERWR#2 38 REV NDL BL (SUTURE) ×2 IMPLANT
TAPE STRIPS DRAPE STRL (GAUZE/BANDAGES/DRESSINGS) ×2 IMPLANT
TRAY FOLEY W/BAG SLVR 14FR (SET/KITS/TRAYS/PACK) ×1 IMPLANT

## 2018-09-18 NOTE — Progress Notes (Signed)
Patient ID: Tom Little, male   DOB: March 16, 1935, 83 y.o.   MRN: [id number]  PROGRESS NOTE    Tom Little  LKG:[id number] DOB: 1935-02-14 DOA: 09/04/2018 PCP: No primary care provider on file.   Brief Narrative:  83 year old male with history of diabetes, hypertension, gout, multiple feet ulcers due to diabetes who had fallen at home on September 08, 2018 and was admitted to [facility] few days ago for sepsis secondary to UTI along with rhabdomyolysis along with left hip fracture, right-sided rib fractures with small right effusion but no pneumothorax and possible fracture of the lateral malleolus of the left ankle.  Patient was transferred to [facility] on 09/08/2018 for further  orthopedic intervention.  Assessment & Plan:   Principal Problem:   Fracture of femoral neck, left (HCC) Active Problems:   UTI (urinary tract infection)   Sepsis (HCC)   HTN (hypertension)   Ulcers of both lower legs (HCC)   Diabetes (HCC)   Left femoral neck fracture after mechanical fall -Status post left hip hemiarthroplasty today by orthopedics.  Follow orthopedics recommendations -Fall precautions.  Pain management  Recent treatment for UTI and sepsis -Patient has completed antibiotic treatment and sepsis has resolved.  No signs of infection at this time.  Will hold off on antibiotics  Multiple skin ulcers -Follow wound care consult recommendations  Diabetes mellitus type 2 -Continue Accu-Cheks with sliding scale coverage.  Hypertension -Blood pressure on the higher side.  Continue lisinopril.  Mild transaminitis -Most likely secondary to recent rhabdomyolysis.  Monitor  Thrombocytopenia -Questionable cause.  Resolved.   DVT prophylaxis: Start Lovenox  code Status: Full Family Communication: None at bedside Disposition Plan: Depends on clinical outcome  Consultants: Orthopedics  Procedures: Left hip hemiarthroplasty on 08/26/2018  Antimicrobials:  None   Subjective: Patient seen and examined at bedside.  He is currently slightly drowsy.  No overnight fever, nausea or vomiting.   Objective: Vitals:   08/26/2018 0436 09/07/2018 0550 09/11/2018 1012 09/09/2018 1015  BP: (!) 177/68 (!) 187/80    Pulse: 66 67    Resp:  20  20  Temp: 98.7 F (37.1 C) 98.1 F (36.7 C) 97.9 F (36.6 C) 97.9 F (36.6 C)  TempSrc: Axillary Oral    SpO2: 92% 92%      Intake/Output Summary (Last 24 hours) at 08/23/2018 1113 Last data filed at 09/21/2018 1014 Gross per 24 hour  Intake 800 ml  Output 450 ml  Net 350 ml   There were no vitals filed for this visit.  Examination:  General exam: Appears calm and comfortable.  Drowsy, wakes up slightly but does not answer much questions.   Respiratory system: Bilateral decreased breath sounds at bases, no wheezing.  Some scattered crackles Cardiovascular system: S1 & S2 heard, Rate controlled Gastrointestinal system: Abdomen is nondistended, soft and nontender. Normal bowel sounds heard.    Data Reviewed: I have personally reviewed following labs and imaging studies  CBC: Recent Labs  Lab 09/17/18 0736 08/30/2018 0355  WBC 10.0 9.4  NEUTROABS  --  5.9  HGB 13.0 12.8*  HCT 41.8 39.0  MCV 91.7 89.2  PLT 133* 152   Basic Metabolic Panel: Recent Labs  Lab 09/17/18 0736 09/08/2018 0355  NA 142 141  K 4.1 3.8  CL 109 105  CO2 25 22  GLUCOSE 121* 134*  BUN 26* 28*  CREATININE 1.08 1.12  CALCIUM 8.1* 8.3*  MG  --  1.8   GFR: CrCl cannot be calculated (  Unknown ideal weight.). Liver Function Tests: Recent Labs  Lab 09/17/18 0736 09/12/2018 0355  AST 47* 44*  ALT 50* 45*  ALKPHOS 68 69  BILITOT 1.2 0.9  PROT 5.3* 5.2*  ALBUMIN 2.1* 2.1*   No results for input(s): LIPASE, AMYLASE in the last 168 hours. No results for input(s): AMMONIA in the last 168 hours. Coagulation Profile: No results for input(s): INR, PROTIME in the last 168 hours. Cardiac Enzymes: Recent Labs  Lab  08/24/2018 0355  CKTOTAL 116   BNP (last 3 results) No results for input(s): PROBNP in the last 8760 hours. HbA1C: No results for input(s): HGBA1C in the last 72 hours. CBG: Recent Labs  Lab 09/17/18 0645 09/17/18 1126 09/17/18 1621 09/17/18 2139 09/01/2018 1013  GLUCAP 112* 104* 161* 142* 141*   Lipid Profile: No results for input(s): CHOL, HDL, LDLCALC, TRIG, CHOLHDL, LDLDIRECT in the last 72 hours. Thyroid Function Tests: Recent Labs    09/17/18 0736  TSH 6.346*   Anemia Panel: No results for input(s): VITAMINB12, FOLATE, FERRITIN, TIBC, IRON, RETICCTPCT in the last 72 hours. Sepsis Labs: No results for input(s): PROCALCITON, LATICACIDVEN in the last 168 hours.  Recent Results (from the past 240 hour(s))  Surgical pcr screen     Status: None   Collection Time: 09/17/18  1:58 AM  Result Value Ref Range Status   MRSA, PCR NEGATIVE NEGATIVE Final   Staphylococcus aureus NEGATIVE NEGATIVE Final    Comment: (NOTE) The Xpert SA Assay (FDA approved for NASAL specimens in patients 4 years of age and older), is one component of a comprehensive surveillance program. It is not intended to diagnose infection nor to guide or monitor treatment. Performed at [facility], [address] [place], [place] [address]          Radiology Studies: Dg Pelvis Portable  Result Date: 09/17/2018 CLINICAL DATA:  Hip fracture. EXAM: PORTABLE PELVIS 1-2 VIEWS COMPARISON:  None. FINDINGS: Fracture of the midportion of the left femoral neck. Distal fracture component has displaced superiorly by approximately 2 cm. No fracture comminution. No other fractures. Hip joints, SI joints and symphysis pubis are normally spaced and aligned. Bones are demineralized.  No bone lesion. Soft tissues unremarkable. IMPRESSION: 1. Left femoral neck fracture, displaced by 2 cm. Electronically Signed   By: Amie Portland M.D.   On: 09/17/2018 16:56   Dg Chest Port 1 View  Result Date:  08/25/2018 CLINICAL DATA:  Preop hip surgery, tachypnea, shortness of breath EXAM: PORTABLE CHEST 1 VIEW COMPARISON:  None. FINDINGS: Lungs are clear.  No pleural effusion or pneumothorax. Cardiomegaly. Cervical spine fixation hardware. IMPRESSION: No evidence of acute cardiopulmonary disease. Electronically Signed   By: Charline Bills M.D.   On: 08/25/2018 08:08   Dg Hand Complete Right  Result Date: 09/17/2018 CLINICAL DATA:  Fracture. EXAM: RIGHT HAND - COMPLETE 3+ VIEW COMPARISON:  None. FINDINGS: No acute fracture visualized on the single provided view. Old healed fracture of the fifth metacarpal. There are degenerative changes involving the scaphoid trapezium trapezoid articulation and multiple interphalangeal joints as well as the third metacarpal phalangeal joint. Bones are demineralized. Soft tissues demonstrate vascular calcifications but are otherwise unremarkable. IMPRESSION: No acute fracture or dislocation. Electronically Signed   By: Amie Portland M.D.   On: 09/17/2018 16:58        Scheduled Meds: . allopurinol  300 mg Oral Daily  . hydrocerin   Topical BID  . insulin aspart  0-15 Units Subcutaneous TID WC  .  insulin aspart  0-5 Units Subcutaneous QHS  . lisinopril  40 mg Oral Daily  . sodium chloride flush  10-40 mL Intracatheter Q12H   Continuous Infusions: . ceFAZolin       LOS: 2 days        Glade Lloyd, MD [facility] Pager [phone]  If 7PM-7AM, please contact night-coverage [email or web address] Password [email or web address] 10-01-2018, 11:13 AM

## 2018-09-18 NOTE — Anesthesia Preprocedure Evaluation (Signed)
Anesthesia Evaluation  Patient identified by MRN, date of birth, ID band Patient awake    Reviewed: Allergy & Precautions, NPO status , Patient's Chart, lab work & pertinent test results  History of Anesthesia Complications Negative for: history of anesthetic complications  Airway Mallampati: III  TM Distance: >3 FB Neck ROM: Full    Dental  (+) Poor Dentition, Dental Advisory Given   Pulmonary shortness of breath,  Shortness of breath, tachypnea, ?pulm edema/right middle lobe infiltrate  Right rhonchi throughout lung fields   + rhonchi        Cardiovascular hypertension, Pt. on medications (-) angina(-) Past MI  Rhythm:Regular     Neuro/Psych negative neurological ROS  negative psych ROS   GI/Hepatic negative GI ROS, Neg liver ROS,   Endo/Other  diabetes, Insulin DependentMorbid obesity  Renal/GU Renal InsufficiencyRenal disease     Musculoskeletal   Abdominal   Peds  Hematology  (+) anemia ,   Anesthesia Other Findings   Reproductive/Obstetrics                             Anesthesia Physical Anesthesia Plan  ASA: III  Anesthesia Plan: General   Post-op Pain Management:    Induction:   PONV Risk Score and Plan: 2 and Ondansetron and Dexamethasone  Airway Management Planned: Oral ETT  Additional Equipment:   Intra-op Plan:   Post-operative Plan: Extubation in OR  Informed Consent: I have reviewed the patients History and Physical, chart, labs and discussed the procedure including the risks, benefits and alternatives for the proposed anesthesia with the patient or authorized representative who has indicated his/her understanding and acceptance.   Dental advisory given  Plan Discussed with: Surgeon and CRNA  Anesthesia Plan Comments:         Anesthesia Quick Evaluation

## 2018-09-18 NOTE — Anesthesia Postprocedure Evaluation (Signed)
Anesthesia Post Note  Patient: Tedd Cottrill  Procedure(s) Performed: ARTHROPLASTY BIPOLAR HIP (HEMIARTHROPLASTY) (Left Hip)     Patient location during evaluation: PACU Anesthesia Type: General Level of consciousness: awake and patient cooperative Pain management: pain level controlled Vital Signs Assessment: post-procedure vital signs reviewed and stable Respiratory status: spontaneous breathing, nonlabored ventilation, respiratory function stable and patient connected to nasal cannula oxygen Cardiovascular status: blood pressure returned to baseline and stable Postop Assessment: no apparent nausea or vomiting Anesthetic complications: no    Last Vitals:  Vitals:   Sep 21, 2018 1127 09/21/18 1630  BP: (!) 172/78 (!) 164/62  Pulse: 69 64  Resp:  16  Temp: 36.4 C 36.9 C  SpO2: 99% 98%    Last Pain:  Vitals:   21-Sep-2018 1932  TempSrc:   PainSc: 0-No pain                 Twylla Arceneaux

## 2018-09-18 NOTE — Interval H&P Note (Signed)
Again talked about risks.  Patient is high risk for perioperative infection secondary to his vascular changes in his legs and stasis wounds.  We feel based on his inability to mobilize without surgery he would still appropriate for this.  All questions answered.  Patient stated he did not have family to discuss case with.  He mentioned a family member but didn't recall their phone number.

## 2018-09-18 NOTE — Op Note (Signed)
Orthopaedic Surgery Operative Note (CSN: [id number])  Tom Little  November 23, 1935 Date of Surgery: 08/31/2018 - 09-26-18   Diagnoses:  left femoral neck fracture remotely with 2 cm shortening  Procedure: Left hip hemiarthroplasty   Operative Finding Successful completion of planned procedure.  Patient had significant shortening but we are able to achieve reasonable length.  There seem to be erosion of the femoral head which was likely consistent with his subacute presentation.  We did send this for pathology.  Instability to flexion 90 degrees abduction and internal rotation to 70 degrees.  Post-operative plan: The patient will be weightbearing as tolerated posterior precautions.  The patient will be omitted to floor.  DVT prophylaxis Lovenox 40 mg each day.  Pain control with PRN pain medication preferring oral medicines.  Follow up plan will be scheduled in approximately 14 days for incision check and XR.  Plans: [employer] Accolade 2 size 5 stem, -4, 50 mm head  Post-Op Diagnosis: Same Surgeons:Primary: Bjorn Pippin, MD Assistants: Janace Litten, OPAC Location: [facility] OR ROOM 06 Anesthesia: General Antibiotics: Ancef 2g preop, Vancomycin 1000mg  locally  Tourniquet time: * No tourniquets in log * Estimated Blood Loss: 100 Complications: None Specimens: None Implants: Implant Name Type Inv. Item Serial No. Manufacturer Lot No. LRB No. Used Action  Jeanette Caprice - [id number] Orthopedic Implant Jeanette Caprice [id number] [facility] [employer]THOPEDICS [id number] Left 1 Implanted  STEM HIP 127 DEG - [id number] Stem STEM HIP 127 DEG [id number] [employer] [id number] Left 1 Implanted  HEAD MODULAR ENDO - [id number] Orthopedic Implant HEAD MODULAR ENDO [id number] [employer] [id number] Left 1 Implanted    Indications for Surgery:   Tom Little is a 83 y.o. male with remote fall and hospitalization at an outside hospital.  He was found to have a femoral neck fracture 7 days after admission when  he was there for sepsis.  He was refused orthopedic care at that hospital secondary to being released as a patient by the local orthopedic practice and was unable be transferred within their system and they asked that we provide care for the patient which we are willing to do.  Is extremely high risk for infection and peri operative complications secondary to his sedentary state for the last week as well as his venous stasis changes in his legs and his history of sepsis.  He was medically optimized and cleared by medicine and deemed to be appropriate for surgery.  Benefits and risks of operative and nonoperative management were discussed prior to surgery with patient/guardian(s) and informed consent form was completed.  Specific risks including infection, need for additional surgery, infection, dislocation, periprosthetic fracture, continued pain   Procedure:   The patient was identified in the preoperative holding area where the surgical site was marked. The patient was taken to the OR where a procedural timeout was called and the above noted anesthesia was induced.  The patient was positioned lateral with a marked to positioner.  Preoperative antibiotics were dosed.  The patient's left hip was prepped and draped in the usual sterile fashion.  A second preoperative timeout was called.      We made an incision centered over the greater trochanter with a scalpel. We used the scalpel to continue to dissect to the fascia. The fascia was pierced with Bovie electrocautery. Mayo scissors were used to cut the fascia in a longitudinal fashion. The gluteus maximus fibers were bluntly split in line with their fibers.   The femur was slowly internally rotated, putting tension on  the posterior structures. Bovie electrocautery was used to dissect the short external rotators off of the insertion onto the femur. After the short external rotators were transected, we visualized the femoral neck fracture. We identified the  sciatic nerve by palpation and verified that it was not in danger from dissection.   We made a T-shaped capsulotomy.  There is significant loss of capsular tissue as well as external rotator tissue due to the subacute nature of the fracture and these being traumatically disrupted.  Head itself was relatively eroded from the femoral neck impacting it it seemed.  Patient was relatively short 2 cm.  We made a neck cut approximately 2 cm above the lesser trochanter with a a reciprocating saw. We removed the femoral head. We used the box cutter to cut away some of the greater trochanter for ease of insertion of the stem. We then used the canal finder to locate the femoral canal.   Using the angle of the femoral neck as our guide for version, we broached sequentially. We trialed components and found appropriate fit and stability.  The patient would come to full extension of the hip.  Hip is stable.  We then removed the trials as well as the broach. We ensured there were no foreign bodies or bone within the acetabulum. We copiously irrigated the wound.  We then carefully placed our stem, size listed above before assembling the head and neck together onto the stem. We then reduced the hip. Again, that was stable in the previously mentioned manipulations.   We repaired the posterior capsule taking care to both repair the T split individual #2 FiberWire sutures.  Short external rotators repaired to the greater trochanter to bone tunnels. We closed the fascia of the iliotibial band and gluteus maximus with running and intterupted Vicryl sutures. We then closed Scarpa's fascia with running Vicryl sutures. Skin was closed with 3-0 Vicryl and 4-0 Monocryl with Steri-Strips and Aquasol dressing was placed.  Janace Litten, OPA-C, present and scrubbed throughout the case, critical for completion in a timely fashion, and for retraction, instrumentation, closure.

## 2018-09-18 NOTE — Anesthesia Procedure Notes (Signed)
Procedure Name: Intubation Date/Time: 09/13/2018 8:26 AM Performed by: Oletta Lamas, CRNA Pre-anesthesia Checklist: Patient identified, Emergency Drugs available, Suction available and Patient being monitored Patient Re-evaluated:Patient Re-evaluated prior to induction Oxygen Delivery Method: Circle System Utilized Preoxygenation: Pre-oxygenation with 100% oxygen Induction Type: IV induction Ventilation: Mask ventilation without difficulty Laryngoscope Size: Mac and 4 Grade View: Grade I Tube type: Oral Tube size: 7.5 mm Number of attempts: 1 Airway Equipment and Method: Stylet Placement Confirmation: ETT inserted through vocal cords under direct vision,  positive ETCO2 and breath sounds checked- equal and bilateral Secured at: 22 cm Tube secured with: Tape Dental Injury: Teeth and Oropharynx as per pre-operative assessment

## 2018-09-18 NOTE — Transfer of Care (Signed)
Immediate Anesthesia Transfer of Care Note  Patient: Tom Little  Procedure(s) Performed: ARTHROPLASTY BIPOLAR HIP (HEMIARTHROPLASTY) (Left Hip)  Patient Location: PACU  Anesthesia Type:General  Level of Consciousness: awake, alert  and oriented  Airway & Oxygen Therapy: Patient Spontanous Breathing and Patient connected to face mask oxygen  Post-op Assessment: Report given to RN and Post -op Vital signs reviewed and stable  Post vital signs: Reviewed and stable  Last Vitals:  Vitals Value Taken Time  BP 161/65 09/12/2018 10:13 AM  Temp    Pulse 71 09/17/2018 10:14 AM  Resp 16 09/17/2018 10:14 AM  SpO2 96 % 09/17/2018 10:14 AM  Vitals shown include unvalidated device data.  Last Pain:  Vitals:   08/28/2018 0553  TempSrc:   PainSc: 10-Worst pain ever         Complications: No apparent anesthesia complications

## 2018-09-19 ENCOUNTER — Inpatient Hospital Stay (HOSPITAL_COMMUNITY): Payer: Medicare Other | Admitting: Certified Registered Nurse Anesthetist

## 2018-09-19 ENCOUNTER — Encounter (HOSPITAL_COMMUNITY): Payer: Self-pay | Admitting: Orthopaedic Surgery

## 2018-09-19 LAB — CBC WITH DIFFERENTIAL/PLATELET
Abs Immature Granulocytes: 0.14 10*3/uL — ABNORMAL HIGH (ref 0.00–0.07)
BASOS PCT: 0 %
Basophils Absolute: 0 10*3/uL (ref 0.0–0.1)
Eosinophils Absolute: 0 10*3/uL (ref 0.0–0.5)
Eosinophils Relative: 0 %
HCT: 36.3 % — ABNORMAL LOW (ref 39.0–52.0)
Hemoglobin: 11.7 g/dL — ABNORMAL LOW (ref 13.0–17.0)
Immature Granulocytes: 1 %
Lymphocytes Relative: 8 %
Lymphs Abs: 1 10*3/uL (ref 0.7–4.0)
MCH: 28.9 pg (ref 26.0–34.0)
MCHC: 32.2 g/dL (ref 30.0–36.0)
MCV: 89.6 fL (ref 80.0–100.0)
MONO ABS: 0.8 10*3/uL (ref 0.1–1.0)
Monocytes Relative: 7 %
NEUTROS ABS: 10.3 10*3/uL — AB (ref 1.7–7.7)
Neutrophils Relative %: 84 %
Platelets: 153 10*3/uL (ref 150–400)
RBC: 4.05 MIL/uL — AB (ref 4.22–5.81)
RDW: 14 % (ref 11.5–15.5)
WBC: 12.3 10*3/uL — AB (ref 4.0–10.5)
nRBC: 0 % (ref 0.0–0.2)

## 2018-09-19 LAB — BASIC METABOLIC PANEL
Anion gap: 7 (ref 5–15)
BUN: 27 mg/dL — AB (ref 8–23)
CO2: 23 mmol/L (ref 22–32)
Calcium: 8 mg/dL — ABNORMAL LOW (ref 8.9–10.3)
Chloride: 109 mmol/L (ref 98–111)
Creatinine, Ser: 1.02 mg/dL (ref 0.61–1.24)
GFR calc Af Amer: >60 mL/min (ref >60)
Glucose, Bld: 177 mg/dL — ABNORMAL HIGH (ref 70–99)
POTASSIUM: 4.3 mmol/L (ref 3.5–5.1)
SODIUM: 139 mmol/L (ref 135–145)

## 2018-09-19 LAB — GLUCOSE, CAPILLARY: Glucose-Capillary: 168 mg/dL — ABNORMAL HIGH (ref 70–99)

## 2018-09-19 LAB — MAGNESIUM: MAGNESIUM: 2 mg/dL (ref 1.7–2.4)

## 2018-09-19 MED ORDER — EPINEPHRINE PF 1 MG/10ML IJ SOSY
PREFILLED_SYRINGE | INTRAMUSCULAR | Status: AC
  Filled 2018-09-19: qty 20

## 2018-09-23 NOTE — Code Documentation (Signed)
CODE BLUE NOTE  Patient Name: Tom Little   MRN: [id number]   Date of Birth/ Sex: 1935/07/19 , male      Admission Date: 09/02/2018  Attending Provider: Glade Lloyd, MD  Primary Diagnosis: Left Hip Fx     Indication: Pt was in his usual state of health until this AM, when he was noted to be unresponsive with no pulse. Code blue was subsequently called. At the time of arrival on scene, ACLS protocol was underway.   Technical Description:  - CPR performance duration:  30 minutes  - Was defibrillation or cardioversion used? No   - Was external pacer placed? Yes  - Was patient intubated pre/post CPR? Yes    Medications Administered: Y = Yes; Blank = No Amiodarone    Atropine  Y  Calcium    Epinephrine  Y  Lidocaine    Magnesium    Norepinephrine  Y  Phenylephrine    Sodium bicarbonate  Y  Vasopressin    Other     Post CPR evaluation:  - Final Status - Was patient successfully resuscitated ? No   Miscellaneous Information:  - Time of death:                         9:15 AM  - Primary team notified?  Yes  - Family Notified? Family attempting to be notified by nursing        Unknown Jim, DO   08/26/2018, 9:47 AM

## 2018-09-23 NOTE — Progress Notes (Addendum)
Called Tom Little primary doctor, Dr. Venita Sheffield office to retrieve patient's contact information. According to their records, his emergency contact person is a "caretaker" named Tom Little, telephone number [phone]. Called Ms Tom Little and requested family next of kin contact information. Stated patient's brother's name is "Tom Little", but could not provide contact number.  Called a number from patient's phone listed as Tom Little at [phone]. Notified him that patient expired. Tom Little stated that Tom Hyppolite is his half brother who he "hasn't seen for years". He stated if we need "information or decisions made" to contact "the woman he's been living with for years." When asked the name and contact information for the woman, he stated, "I don't know her name." When I asked if the woman's name could be Tom Little, he stated, "yes, that's her. Call her." Paged Dr. Mikey Bussing to notify.

## 2018-09-23 NOTE — Progress Notes (Signed)
ORTHOPAEDIC PROGRESS NOTE  s/p Procedure(s): ARTHROPLASTY BIPOLAR HIP (HEMIARTHROPLASTY)  L  SUBJECTIVE: Reports mild pain about operative site. No chest pain. No SOB. No nausea/vomiting. No other complaints.  OBJECTIVE: Pe: left lower extremity: incision CDI, abduction pillow in place, leg lengths equal, warm well perfused foot, intact EHL/TA/GSC  Vitals:   09/03/2018 2144 09/15/2018 0602  BP: (!) 157/74 (!) 155/56  Pulse: 62 63  Resp:    Temp: 98 F (36.7 C) 98.5 F (36.9 C)  SpO2: 97% 98%     ASSESSMENT: Tom Little is a 83 y.o. male doing well postoperatively.  PLAN: Weightbearing: WBAT LLE with posterior hip precautions Insicional and dressing care: OK to remove dressings 7 days postop, leave steri strips and leave open to air with dry gauze PRN Orthopedic device(s): Abduction pillow  Showering: PRN VTE prophylaxis: Lovenox 40mg  qd x6 weeks, if ambulating well will transition to aspirin in clinic Pain control: PRN meds, minimize narcs Follow - up plan: 2 weeks with XR in clinic Contact information:  Weekdays 8-5 Ramond Marrow MD [phone], After hours and holidays please check [email or web address] for group call information for [facility]

## 2018-09-23 NOTE — Evaluation (Signed)
Physical Therapy Evaluation Patient Details Name: Tom Little MRN: [id number] DOB: [birth year] Today's Date: 09/07/2018   History of Present Illness  83 y.o. male with recent hospitalization in [place] for sepsis.  He apparently per report had falls prior to admission a week ago and it was determined yeterday that he had a left femoral neck fracture.  s/p Lt hemiarthroplasty 10-13-18  Clinical Impression  Pt participated in skilled PT eval and treatment, upon completion of eval pt states "I feel dizzy", pt then unresponsive and RN made aware and code called.    Follow Up Recommendations SNF    Equipment Recommendations  None recommended by PT    Recommendations for Other Services OT consult     Precautions / Restrictions Precautions Precautions: Fall Restrictions Weight Bearing Restrictions: No      Mobility  Bed Mobility Overal bed mobility: Needs Assistance Bed Mobility: Supine to Sit     Supine to sit: Mod assist     General bed mobility comments: assist for bilat LEs and trunk  Transfers Overall transfer level: Needs assistance Equipment used: Rolling walker (2 wheeled) Transfers: Sit to/from [employer] Sit to Stand: Mod assist Stand pivot transfers: Mod assist       General transfer comment: mod A from elevated bed, cues for UE placement, assist for wt shifting when pivoting  Ambulation/Gait                Stairs            Wheelchair Mobility    Modified Rankin (Stroke Patients Only)       Balance Overall balance assessment: Needs assistance Sitting-balance support: Feet supported Sitting balance-Leahy Scale: Fair       Standing balance-Leahy Scale: Poor                               Pertinent Vitals/Pain Pain Assessment: No/denies pain    Home Living Family/patient expects to be discharged to:: Private residence Living Arrangements: Alone Available Help at Discharge: Available  PRN/intermittently;Personal care attendant Type of Home: House Home Access: Stairs to enter   Secretary/administrator of Steps: 5 Home Layout: One level Home Equipment: Cane - single point      Prior Function Level of Independence: Independent with assistive device(s)         Comments: pt states he has a "lady" to help with meals and errands     Hand Dominance        Extremity/Trunk Assessment   Upper Extremity Assessment Upper Extremity Assessment: Generalized weakness    Lower Extremity Assessment Lower Extremity Assessment: Generalized weakness    Cervical / Trunk Assessment Cervical / Trunk Assessment: Kyphotic  Communication   Communication: No difficulties  Cognition Arousal/Alertness: Awake/alert Behavior During Therapy: WFL for tasks assessed/performed Overall Cognitive Status: Within Functional Limits for tasks assessed                                        General Comments      Exercises     Assessment/Plan    PT Assessment Patient needs continued PT services  PT Problem List Cardiopulmonary status limiting activity;Decreased strength;Decreased mobility;Decreased activity tolerance;Decreased knowledge of use of DME;Decreased balance       PT Treatment Interventions DME instruction;Therapeutic activities;Gait training;Therapeutic exercise;Patient/family education;Modalities;Stair training;Balance training;Functional mobility training;Neuromuscular re-education;Manual techniques  PT Goals (Current goals can be found in the Care Plan section)  Acute Rehab PT Goals Patient Stated Goal: feel better PT Goal Formulation: With patient Time For Goal Achievement: 09/26/18 Potential to Achieve Goals: Fair    Frequency Min 2X/week   Barriers to discharge Inaccessible home environment;Decreased caregiver support      Co-evaluation               AM-PAC PT "6 Clicks" Daily Activity  Outcome Measure Difficulty turning over in  bed (including adjusting bedclothes, sheets and blankets)?: A Lot Difficulty moving from lying on back to sitting on the side of the bed? : A Lot Difficulty sitting down on and standing up from a chair with arms (e.g., wheelchair, bedside commode, etc,.)?: A Lot Help needed moving to and from a bed to chair (including a wheelchair)?: A Lot Help needed walking in hospital room?: A Lot Help needed climbing 3-5 steps with a railing? : Total 6 Click Score: 11    End of Session Equipment Utilized During Treatment: Gait belt;Oxygen Activity Tolerance: Patient tolerated treatment well Patient left: in chair;with chair alarm set;with nursing/sitter in room;with call bell/phone within reach Nurse Communication: Mobility status PT Visit Diagnosis: Repeated falls (R29.6);Difficulty in walking, not elsewhere classified (R26.2);Muscle weakness (generalized) (M62.81)    Time: 8295-6213 PT Time Calculation (min) (ACUTE ONLY): 27 min   Charges:   PT Evaluation $PT Eval Moderate Complexity: 1 Mod PT Treatments $Therapeutic Activity: 8-22 mins        Tom Little, PT, DPT  Tom Little 08/31/2018, 10:06 AM

## 2018-09-23 NOTE — Anesthesia Procedure Notes (Signed)
Procedure Name: Intubation Date/Time: 08/28/2018 9:00 AM Performed by: Marena Chancy, CRNA Pre-anesthesia Checklist: Patient identified, Emergency Drugs available, Suction available and Patient being monitored Oxygen Delivery Method: Ambu bag Preoxygenation: Pre-oxygenation with 100% oxygen Induction Type: IV induction Ventilation: Mask ventilation without difficulty Laryngoscope Size: Miller and 3 Grade View: Grade I Tube type: Subglottic suction tube Tube size: 7.5 mm Number of attempts: 1 Airway Equipment and Method: Stylet Placement Confirmation: ETT inserted through vocal cords under direct vision,  breath sounds checked- equal and bilateral and CO2 detector Secured at: 24 cm Tube secured with: Tape Dental Injury: Teeth and Oropharynx as per pre-operative assessment

## 2018-09-23 NOTE — Progress Notes (Signed)
Received call from [place] regarding funeral home. Stated she has no money for funeral services, but stated that patient's brother said to "try [employer]" in [place], [place], for cremation services.

## 2018-09-23 NOTE — Death Summary Note (Signed)
Death Summary  Francisco Ostrovsky AOZ:[id number] DOB: 1935/08/25 DOA: 2018/10/11  PCP: No primary care provider on file.  Admit date: 10-11-2018 Date of Death: October 14, 2018   History of present illness and hospital course: 83 year old male with history of diabetes, hypertension, gout, multiple feet ulcers due to diabetes who had fallen at home on September 08, 2018 and was admitted to [facility] few days ago for sepsis secondary to UTI along with rhabdomyolysis along with left hip fracture, right-sided rib fractures with small right effusion but no pneumothorax and possible fracture of the lateral malleolus of the left ankle.  Patient was transferred to [facility] on Oct 11, 2018 for further  orthopedic intervention.  Patient had left hip hemiarthroplasty on Oct 13, 2018.  Patient had cardiac arrest on 10-14-2018 and despite ACLS and resuscitative measures, he expired on 2018/10/14.  Final Diagnoses:  Cardiac arrest Left femoral neck fracture after mechanical fall Recent treatment for UTI and sepsis Multiple skin ulcers Diabetes mellitus type 2 Hypertension Mild transaminitis Thrombocytopenia: Resolved   The results of significant diagnostics from this hospitalization (including imaging, microbiology, ancillary and laboratory) are listed below for reference.    Significant Diagnostic Studies: Pelvis Portable  Result Date: 10/13/2018 CLINICAL DATA:  Status post left hip replacement. EXAM: PORTABLE PELVIS 1-2 VIEWS COMPARISON:  Yesterday. FINDINGS: Interval left hip prosthesis in satisfactory position and alignment. No fracture or dislocation seen. IMPRESSION: Satisfactory postoperative appearance of a left hip prosthesis. Electronically Signed   By: Beckie Salts M.D.   On: 10-13-18 12:39   Dg Pelvis Portable  Result Date: 09/17/2018 CLINICAL DATA:  Hip fracture. EXAM: PORTABLE PELVIS 1-2 VIEWS COMPARISON:  None. FINDINGS: Fracture of the midportion of the left femoral neck.  Distal fracture component has displaced superiorly by approximately 2 cm. No fracture comminution. No other fractures. Hip joints, SI joints and symphysis pubis are normally spaced and aligned. Bones are demineralized.  No bone lesion. Soft tissues unremarkable. IMPRESSION: 1. Left femoral neck fracture, displaced by 2 cm. Electronically Signed   By: Amie Portland M.D.   On: 09/17/2018 16:56   Dg Chest Port 1 View  Result Date: 2018-10-13 CLINICAL DATA:  Preop hip surgery, tachypnea, shortness of breath EXAM: PORTABLE CHEST 1 VIEW COMPARISON:  None. FINDINGS: Lungs are clear.  No pleural effusion or pneumothorax. Cardiomegaly. Cervical spine fixation hardware. IMPRESSION: No evidence of acute cardiopulmonary disease. Electronically Signed   By: Charline Bills M.D.   On: 10/13/2018 08:08   Dg Hand Complete Right  Result Date: 09/17/2018 CLINICAL DATA:  Fracture. EXAM: RIGHT HAND - COMPLETE 3+ VIEW COMPARISON:  None. FINDINGS: No acute fracture visualized on the single provided view. Old healed fracture of the fifth metacarpal. There are degenerative changes involving the scaphoid trapezium trapezoid articulation and multiple interphalangeal joints as well as the third metacarpal phalangeal joint. Bones are demineralized. Soft tissues demonstrate vascular calcifications but are otherwise unremarkable. IMPRESSION: No acute fracture or dislocation. Electronically Signed   By: Amie Portland M.D.   On: 09/17/2018 16:58    Microbiology: Recent Results (from the past 240 hour(s))  Surgical pcr screen     Status: None   Collection Time: 09/17/18  1:58 AM  Result Value Ref Range Status   MRSA, PCR NEGATIVE NEGATIVE Final   Staphylococcus aureus NEGATIVE NEGATIVE Final    Comment: (NOTE) The Xpert SA Assay (FDA approved for NASAL specimens in patients 42 years of age and older), is one component of a comprehensive surveillance program. It is not intended to diagnose  infection nor to guide or monitor  treatment. Performed at [facility], [address], [place], [place] [address]      Labs: Basic Metabolic Panel: Recent Labs  Lab 09/17/18 0736 09/06/2018 0355 October 13, 2018 0510  NA 142 141 139  K 4.1 3.8 4.3  CL 109 105 109  CO2 25 22 23   GLUCOSE 121* 134* 177*  BUN 26* 28* 27*  CREATININE 1.08 1.12 1.02  CALCIUM 8.1* 8.3* 8.0*  MG  --  1.8 2.0   Liver Function Tests: Recent Labs  Lab 09/17/18 0736 09/11/2018 0355  AST 47* 44*  ALT 50* 45*  ALKPHOS 68 69  BILITOT 1.2 0.9  PROT 5.3* 5.2*  ALBUMIN 2.1* 2.1*   No results for input(s): LIPASE, AMYLASE in the last 168 hours. No results for input(s): AMMONIA in the last 168 hours. CBC: Recent Labs  Lab 09/17/18 0736 09/22/2018 0355 10-13-2018 0510  WBC 10.0 9.4 12.3*  NEUTROABS  --  5.9 10.3*  HGB 13.0 12.8* 11.7*  HCT 41.8 39.0 36.3*  MCV 91.7 89.2 89.6  PLT 133* 152 153   Cardiac Enzymes: Recent Labs  Lab 09/02/2018 0355  CKTOTAL 116   D-Dimer No results for input(s): DDIMER in the last 72 hours. BNP: Invalid input(s): POCBNP CBG: Recent Labs  Lab 09/22/2018 1154 09/03/2018 1239 08/23/2018 1628 09/05/2018 2147 10-13-2018 0605  GLUCAP 167* 196* 284* 258* 168*   Anemia work up No results for input(s): VITAMINB12, FOLATE, FERRITIN, TIBC, IRON, RETICCTPCT in the last 72 hours. Urinalysis No results found for: COLORURINE, APPEARANCEUR, LABSPEC, PHURINE, GLUCOSEU, HGBUR, BILIRUBINUR, KETONESUR, PROTEINUR, UROBILINOGEN, NITRITE, LEUKOCYTESUR Sepsis Labs Invalid input(s): PROCALCITONIN,  WBC,  LACTICIDVEN     SIGNED:  Glade Lloyd, MD  [facility] 13-Oct-2018, 1:25 PM Pager: [phone]  If 7PM-7AM, please contact night-coverage [email or web address] Password [email or web address]

## 2018-09-23 NOTE — Progress Notes (Signed)
   10-19-18 0920  Clinical Encounter Type  Visited With Patient  Visit Type Initial  Referral From Other (Comment) (Code Blue)  Consult/Referral To Chaplain  The Chaplain responded to Code Blue. Chaplain supported the healthcare team and CM in the search to identify and notify Pt. family/friends of Pt. change in condition.  At the time the chaplain left the floor, no contacts to friends or family were made. The RN confirmed the Pt. death with the chaplain. The chaplain was present with the Pt. PT. The PT stated it was her first Pt. death during a therapy session. The chaplain offered spiritual care for the PT.  The PT shared her plans to talk to her team about the death.

## 2018-09-23 DEATH — deceased

## 2019-02-16 IMAGING — DX DG PORTABLE PELVIS
1 series · 1 of 1 positions shown · non-contrast
Comparison: Yesterday.

CLINICAL DATA: Status post left hip replacement.

EXAM:
PORTABLE PELVIS 1-2 VIEWS

[pelvis ap]
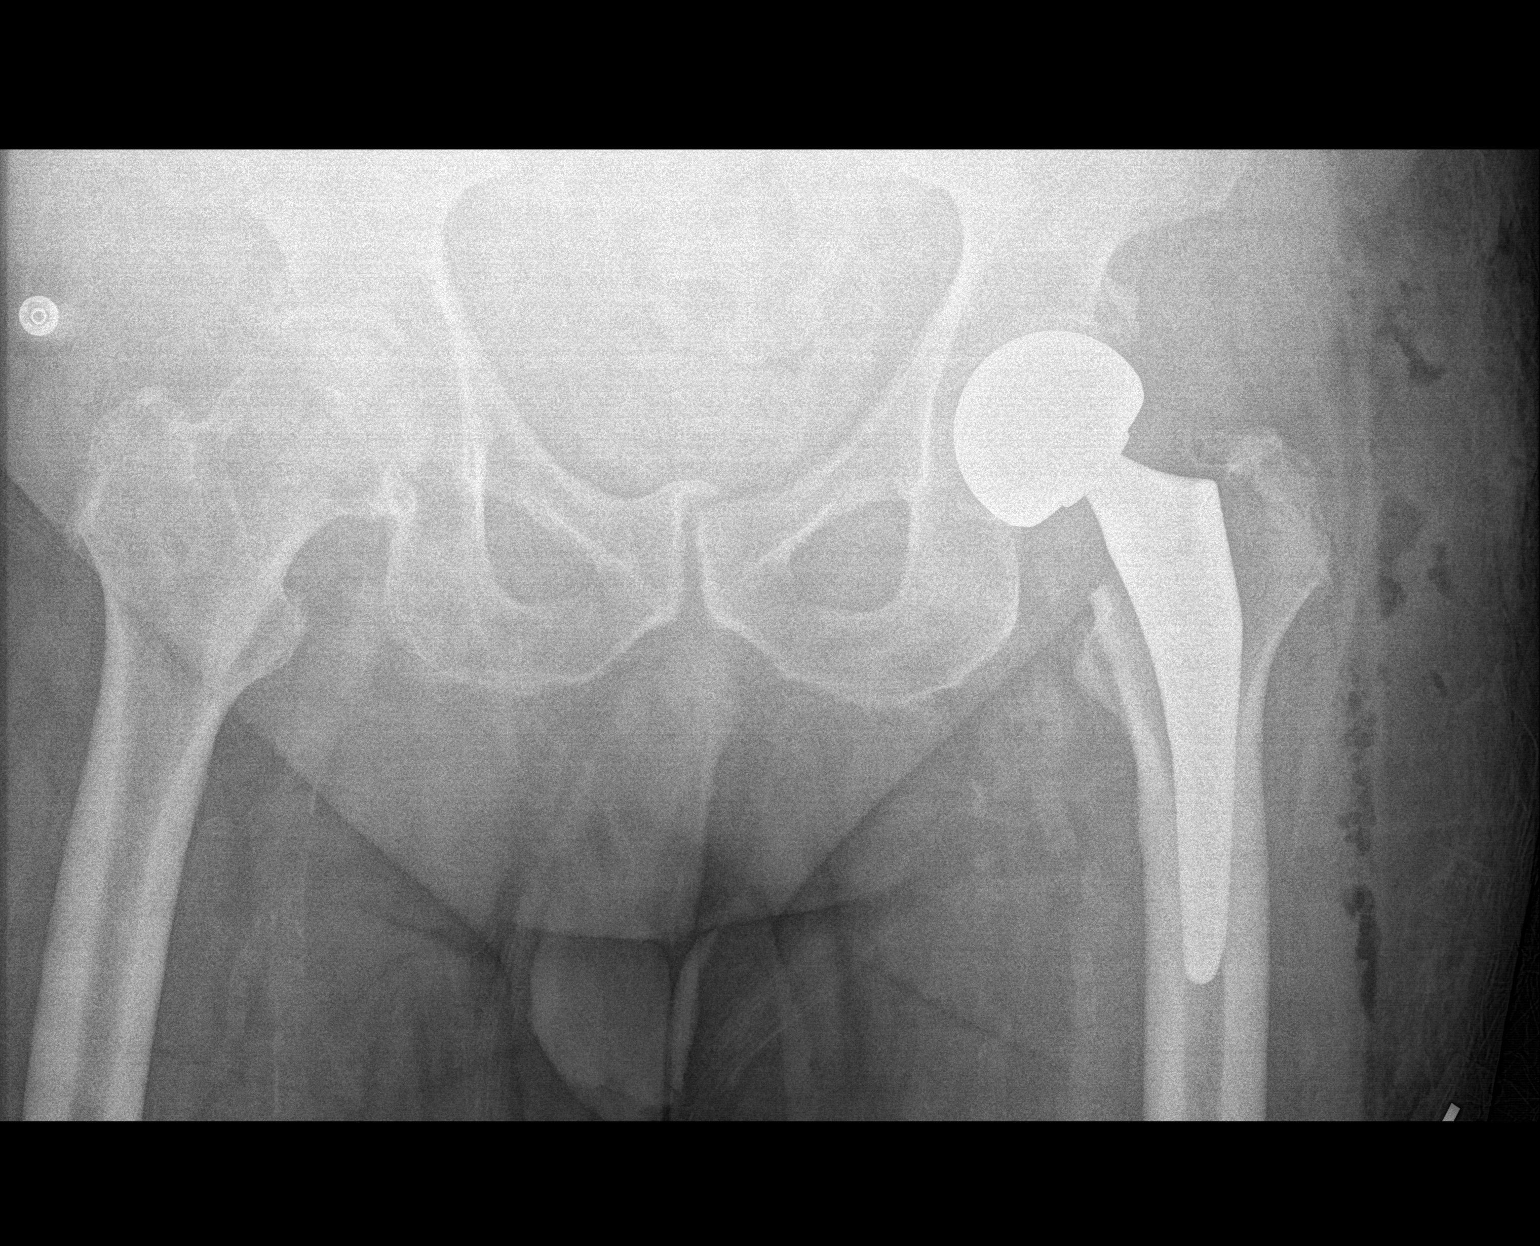

[1 of 1 positions shown; findings below may reference images not displayed]

FINDINGS: Interval left hip prosthesis in satisfactory position and alignment.
No fracture or dislocation seen.
IMPRESSION: Satisfactory postoperative appearance of a left hip prosthesis.
